# Patient Record
Sex: Male | Born: 1960
Health system: Southern US, Community
[De-identification: ages and names within clinical notes are randomized; demographics above are authoritative.]

## PROBLEM LIST (undated history)

## (undated) DIAGNOSIS — I10 Essential (primary) hypertension: Secondary | ICD-10-CM

## (undated) DIAGNOSIS — E785 Hyperlipidemia, unspecified: Secondary | ICD-10-CM

## (undated) HISTORY — DX: Hyperlipidemia, unspecified: E78.5

## (undated) HISTORY — DX: Essential (primary) hypertension: I10

---

## 2011-08-11 ENCOUNTER — Other Ambulatory Visit: Payer: Self-pay | Admitting: Otolaryngology

## 2011-08-16 ENCOUNTER — Ambulatory Visit
Admission: RE | Admit: 2011-08-16 | Discharge: 2011-08-16 | Disposition: A | Discharge status: Home / Self Care | Payer: BC Managed Care – PPO | Source: Ambulatory Visit | Attending: Otolaryngology | Admitting: Otolaryngology

## 2011-08-16 MED ORDER — GADOBENATE DIMEGLUMINE 529 MG/ML IV SOLN
16.0000 mL | Freq: Once | INTRAVENOUS | Status: AC | PRN
  Administered 2011-08-16: 16 mL via INTRAVENOUS

## 2011-08-17 ENCOUNTER — Other Ambulatory Visit: Payer: Self-pay | Admitting: Family Medicine

## 2011-08-17 DIAGNOSIS — N6312 Unspecified lump in the right breast, upper inner quadrant: Secondary | ICD-10-CM

## 2011-08-19 ENCOUNTER — Ambulatory Visit
Admission: RE | Admit: 2011-08-19 | Discharge: 2011-08-19 | Disposition: A | Discharge status: Home / Self Care | Payer: BC Managed Care – PPO | Source: Ambulatory Visit | Attending: Family Medicine | Admitting: Family Medicine

## 2011-08-19 DIAGNOSIS — N6312 Unspecified lump in the right breast, upper inner quadrant: Secondary | ICD-10-CM

## 2015-01-28 ENCOUNTER — Ambulatory Visit: Payer: Self-pay | Admitting: Podiatry

## 2015-09-11 ENCOUNTER — Other Ambulatory Visit: Payer: Self-pay | Admitting: Otolaryngology

## 2015-09-11 DIAGNOSIS — H903 Sensorineural hearing loss, bilateral: Secondary | ICD-10-CM

## 2015-09-19 ENCOUNTER — Ambulatory Visit
Admission: RE | Admit: 2015-09-19 | Discharge: 2015-09-19 | Disposition: A | Discharge status: Home / Self Care | Payer: Self-pay | Source: Ambulatory Visit | Attending: Otolaryngology | Admitting: Otolaryngology

## 2015-09-19 DIAGNOSIS — H903 Sensorineural hearing loss, bilateral: Secondary | ICD-10-CM

## 2015-09-19 MED ORDER — GADOBENATE DIMEGLUMINE 529 MG/ML IV SOLN
16.0000 mL | Freq: Once | INTRAVENOUS | Status: AC | PRN
  Administered 2015-09-19: 16 mL via INTRAVENOUS

## 2016-02-23 DIAGNOSIS — D333 Benign neoplasm of cranial nerves: Secondary | ICD-10-CM | POA: Diagnosis not present

## 2016-03-31 DIAGNOSIS — D333 Benign neoplasm of cranial nerves: Secondary | ICD-10-CM | POA: Diagnosis not present

## 2016-04-08 DIAGNOSIS — I10 Essential (primary) hypertension: Secondary | ICD-10-CM | POA: Diagnosis not present

## 2016-04-08 DIAGNOSIS — Z125 Encounter for screening for malignant neoplasm of prostate: Secondary | ICD-10-CM | POA: Diagnosis not present

## 2016-04-08 DIAGNOSIS — Z0000 Encounter for general adult medical examination without abnormal findings: Secondary | ICD-10-CM | POA: Diagnosis not present

## 2016-04-19 DIAGNOSIS — I10 Essential (primary) hypertension: Secondary | ICD-10-CM | POA: Diagnosis not present

## 2016-04-19 DIAGNOSIS — E784 Other hyperlipidemia: Secondary | ICD-10-CM | POA: Diagnosis not present

## 2016-04-19 DIAGNOSIS — R51 Headache: Secondary | ICD-10-CM | POA: Diagnosis not present

## 2016-04-19 DIAGNOSIS — Z125 Encounter for screening for malignant neoplasm of prostate: Secondary | ICD-10-CM | POA: Diagnosis not present

## 2016-04-19 DIAGNOSIS — Z0000 Encounter for general adult medical examination without abnormal findings: Secondary | ICD-10-CM | POA: Diagnosis not present

## 2016-04-19 DIAGNOSIS — R740 Nonspecific elevation of levels of transaminase and lactic acid dehydrogenase [LDH]: Secondary | ICD-10-CM | POA: Diagnosis not present

## 2016-04-19 DIAGNOSIS — Z1389 Encounter for screening for other disorder: Secondary | ICD-10-CM | POA: Diagnosis not present

## 2016-05-17 DIAGNOSIS — Z1212 Encounter for screening for malignant neoplasm of rectum: Secondary | ICD-10-CM | POA: Diagnosis not present

## 2016-06-08 DIAGNOSIS — M7062 Trochanteric bursitis, left hip: Secondary | ICD-10-CM | POA: Diagnosis not present

## 2016-06-08 DIAGNOSIS — Z6829 Body mass index (BMI) 29.0-29.9, adult: Secondary | ICD-10-CM | POA: Diagnosis not present

## 2016-06-10 DIAGNOSIS — Z79899 Other long term (current) drug therapy: Secondary | ICD-10-CM | POA: Diagnosis not present

## 2016-06-10 DIAGNOSIS — M5416 Radiculopathy, lumbar region: Secondary | ICD-10-CM | POA: Diagnosis not present

## 2016-09-19 DIAGNOSIS — D3610 Benign neoplasm of peripheral nerves and autonomic nervous system, unspecified: Secondary | ICD-10-CM | POA: Diagnosis not present

## 2016-09-19 DIAGNOSIS — Z6829 Body mass index (BMI) 29.0-29.9, adult: Secondary | ICD-10-CM | POA: Diagnosis not present

## 2016-09-19 DIAGNOSIS — I10 Essential (primary) hypertension: Secondary | ICD-10-CM | POA: Diagnosis not present

## 2016-09-19 DIAGNOSIS — H8113 Benign paroxysmal vertigo, bilateral: Secondary | ICD-10-CM | POA: Diagnosis not present

## 2016-11-14 DIAGNOSIS — D333 Benign neoplasm of cranial nerves: Secondary | ICD-10-CM | POA: Diagnosis not present

## 2016-11-14 DIAGNOSIS — D3610 Benign neoplasm of peripheral nerves and autonomic nervous system, unspecified: Secondary | ICD-10-CM | POA: Diagnosis not present

## 2016-12-10 IMAGING — MR MR HEAD WO/W CM
10 of 11 series · 42 of 48 positions shown · IV contrast (multihance)
Comparison: 08/16/2011

CLINICAL DATA: Sudden hearing loss on the right 2 months ago.

Creatinine was obtained on site at [HOSPITAL] at [HOSPITAL].
Results: Creatinine 1.1 mg/dL.
EXAM:
MRI HEAD WITHOUT AND WITH CONTRAST
TECHNIQUE: Multiplanar, multiecho pulse sequences of the brain and surrounding
structures were obtained without and with intravenous contrast.
CONTRAST:  16mL MULTIHANCE GADOBENATE DIMEGLUMINE 529 MG/ML IV SOLN

[Series 5: T1 · sagittal · 4.0mm · 0.72mm/px · 3 of 27 slices shown (1 of 3)]
[im 1/27]
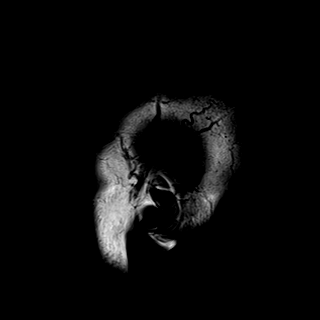
[im 14/27]
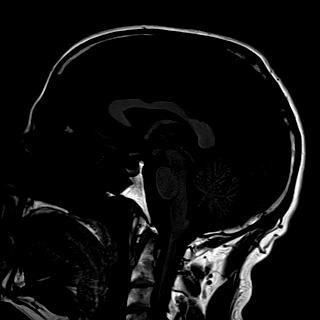
[im 27/27]
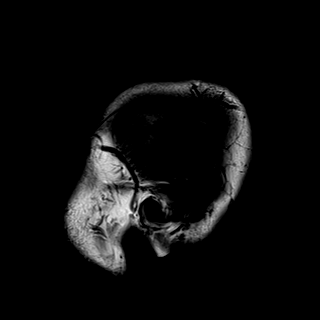

[Series 6: DWI · axial · 3.0mm · 1.44mm/px · z∈[-41,+115]mm · 9 of 86 slices shown (1 of 2)]
[im 1/86]
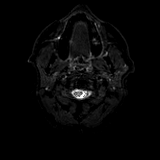
[im 11/86]
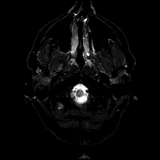
[im 22/86]
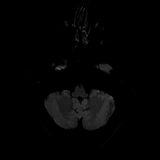
[im 32/86]
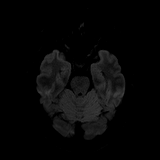
[im 43/86]
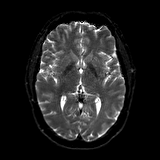
[im 54/86]
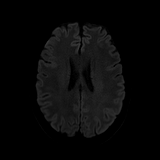
[im 64/86]
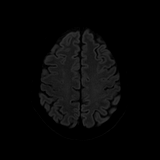
[im 75/86]
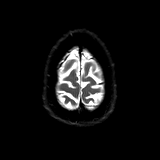
[im 86/86]
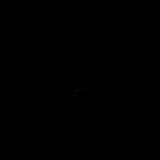

[Series 7: DWI · axial · 3.0mm · 1.44mm/px · z∈[-41,+115]mm · 4 of 43 slices shown (2 of 2)]
[im 1/43]
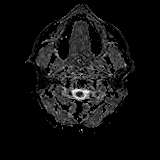
[im 15/43]
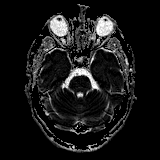
[im 29/43]
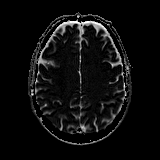
[im 43/43]
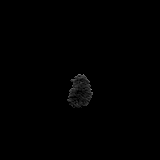

[Series 8: T2 · axial · 4.0mm · 0.36mm/px · z∈[-22,+107]mm · 3 of 27 slices shown]
[im 1/27]
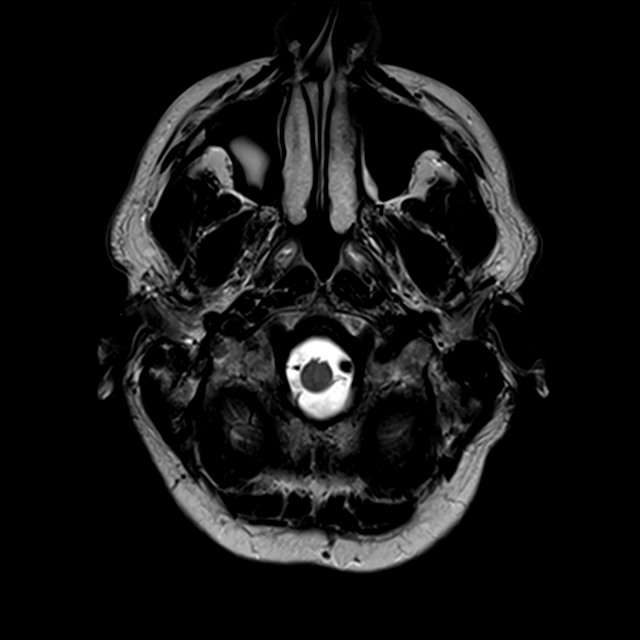
[im 14/27]
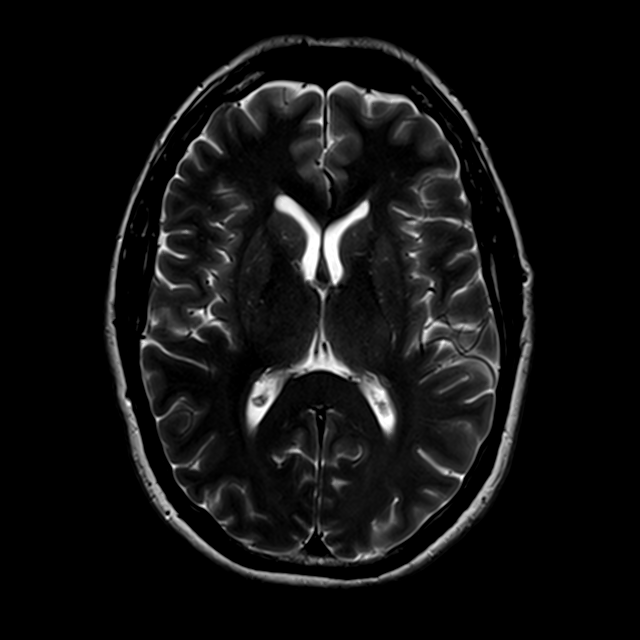
[im 27/27]
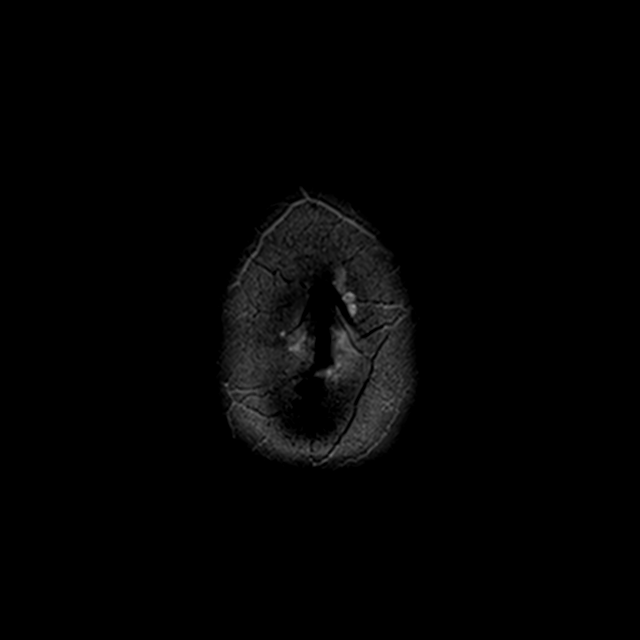

[Series 9: FLAIR · axial · 4.0mm · 0.72mm/px · z∈[-22,+107]mm · 3 of 27 slices shown]
[im 1/27]
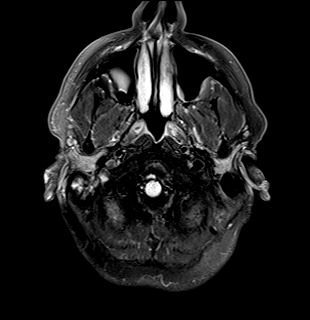
[im 14/27]
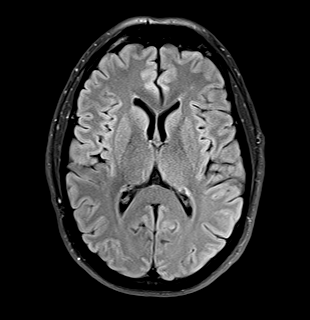
[im 27/27]
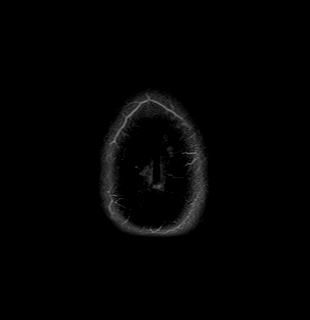

[Series 10: T1 · coronal · 2.5mm · 0.56mm/px · 1 of 11 slices shown (2 of 3)]
[im 1/11]
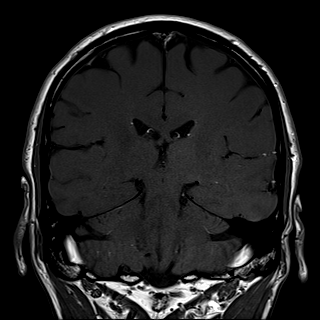

[Series 11: T1 · axial · 2.5mm · 0.59mm/px · 1 of 11 slices shown (3 of 3)]
[im 1/11]
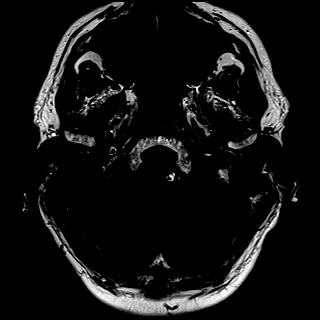

[Series 13: T1 post-contrast · coronal · 2.5mm · 0.56mm/px · 1 of 11 slices shown (1 of 3)]
[im 1/11]
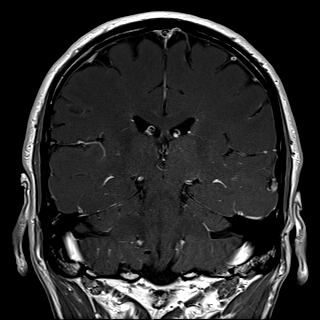

[Series 14: T1 post-contrast · axial · 2.5mm · 0.59mm/px · 1 of 11 slices shown (2 of 3)]
[im 1/11]
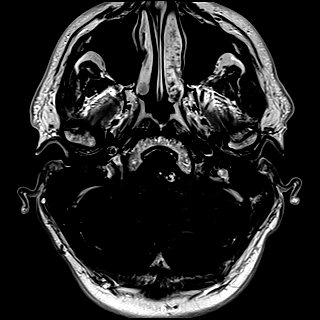

[Series 15: T1 post-contrast · axial · 1.0mm · 0.90mm/px · z∈[-43,+109]mm · 16 of 160 slices shown (3 of 3)]
[im 1/160]
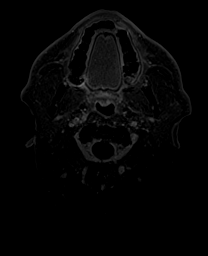
[im 11/160]
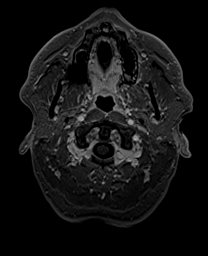
[im 22/160]
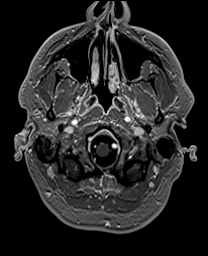
[im 32/160]
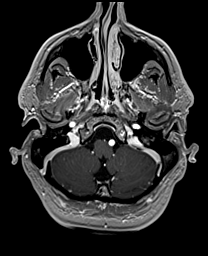
[im 43/160]
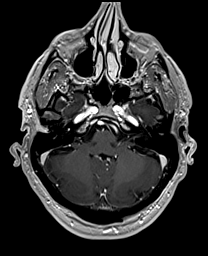
[im 54/160]
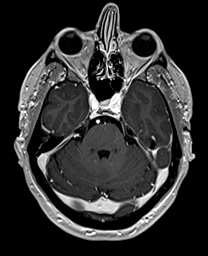
[im 64/160]
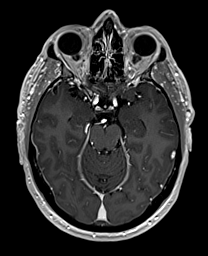
[im 75/160]
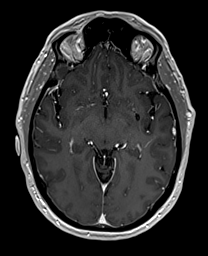
[im 85/160]
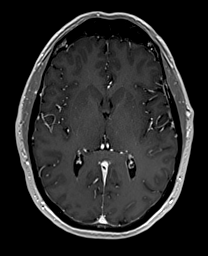
[im 96/160]
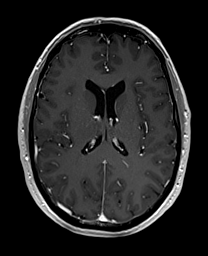
[im 107/160]
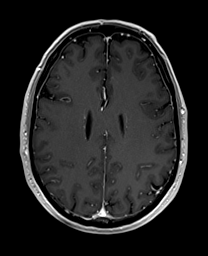
[im 117/160]
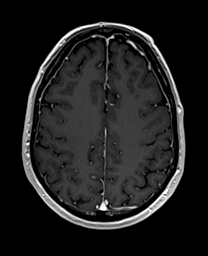
[im 128/160]
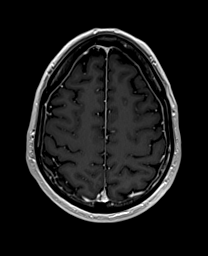
[im 138/160]
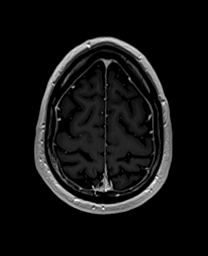
[im 149/160]
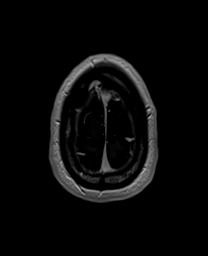
[im 160/160]
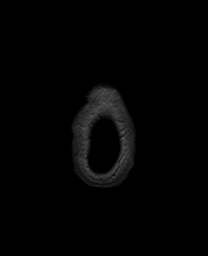

[42 of 48 positions shown; findings below may reference images not displayed]

FINDINGS: Diffusion imaging does not show any acute or subacute infarction. No
evidence of old small or large vessel insult. No accelerated brain
atrophy. No evidence of intra-axial mass lesion, hemorrhage,
hydrocephalus or extra-axial collection. No fluid in the sinuses,
middle ears or mastoids. No pituitary mass. Major vessels at the
base of the brain show flow.

CP angle regions are normal. No vestibular schwannoma or other
lesion. Seventh and eighth nerve complexes appear normal.

As seen previously, there is intense enhancement in the region of
the basal turn of the cochlear on the right. This is more prominent
than was seen previously. Whereas it is possible that the patient
could have recurrent viral labyrinthitis, today's findings elevate
the possibility of a schwannoma in this region.

No abnormal enhancement elsewhere on examination.
IMPRESSION: Re- demonstration of abnormal enhancement along the basal turn of
the cochlear on the right, more prominent than was seen on the study
of 5014. The differential diagnosis is that of recurrent viral
labyrinthitis versus mass lesion (schwannoma). Recurrence of viral
labyrinthitis would seem extraordinary. The fact that the
enhancement is more extensive today also favors the presence of an
underlying mass lesion. If the patient's symptoms were to resolve,
it would be interesting to repeat the scan to see if the enhancement
had resolved. That would exclude tumor.

## 2017-04-17 DIAGNOSIS — R82998 Other abnormal findings in urine: Secondary | ICD-10-CM | POA: Diagnosis not present

## 2017-04-17 DIAGNOSIS — Z0000 Encounter for general adult medical examination without abnormal findings: Secondary | ICD-10-CM | POA: Diagnosis not present

## 2017-04-17 DIAGNOSIS — Z125 Encounter for screening for malignant neoplasm of prostate: Secondary | ICD-10-CM | POA: Diagnosis not present

## 2017-04-17 DIAGNOSIS — I10 Essential (primary) hypertension: Secondary | ICD-10-CM | POA: Diagnosis not present

## 2017-04-25 DIAGNOSIS — Z1212 Encounter for screening for malignant neoplasm of rectum: Secondary | ICD-10-CM | POA: Diagnosis not present

## 2017-05-01 DIAGNOSIS — Z1389 Encounter for screening for other disorder: Secondary | ICD-10-CM | POA: Diagnosis not present

## 2017-05-01 DIAGNOSIS — E7849 Other hyperlipidemia: Secondary | ICD-10-CM | POA: Diagnosis not present

## 2017-05-01 DIAGNOSIS — Q678 Other congenital deformities of chest: Secondary | ICD-10-CM | POA: Diagnosis not present

## 2017-05-01 DIAGNOSIS — L218 Other seborrheic dermatitis: Secondary | ICD-10-CM | POA: Diagnosis not present

## 2017-05-01 DIAGNOSIS — Z0000 Encounter for general adult medical examination without abnormal findings: Secondary | ICD-10-CM | POA: Diagnosis not present

## 2017-05-01 DIAGNOSIS — I10 Essential (primary) hypertension: Secondary | ICD-10-CM | POA: Diagnosis not present

## 2017-10-30 DIAGNOSIS — L821 Other seborrheic keratosis: Secondary | ICD-10-CM | POA: Diagnosis not present

## 2017-10-30 DIAGNOSIS — D485 Neoplasm of uncertain behavior of skin: Secondary | ICD-10-CM | POA: Diagnosis not present

## 2017-10-30 DIAGNOSIS — D2271 Melanocytic nevi of right lower limb, including hip: Secondary | ICD-10-CM | POA: Diagnosis not present

## 2017-10-30 DIAGNOSIS — L988 Other specified disorders of the skin and subcutaneous tissue: Secondary | ICD-10-CM | POA: Diagnosis not present

## 2017-10-30 DIAGNOSIS — D2261 Melanocytic nevi of right upper limb, including shoulder: Secondary | ICD-10-CM | POA: Diagnosis not present

## 2017-12-12 DIAGNOSIS — D485 Neoplasm of uncertain behavior of skin: Secondary | ICD-10-CM | POA: Diagnosis not present

## 2017-12-22 DIAGNOSIS — Z4802 Encounter for removal of sutures: Secondary | ICD-10-CM | POA: Diagnosis not present

## 2018-04-19 DIAGNOSIS — Z6827 Body mass index (BMI) 27.0-27.9, adult: Secondary | ICD-10-CM | POA: Diagnosis not present

## 2018-04-19 DIAGNOSIS — J09X2 Influenza due to identified novel influenza A virus with other respiratory manifestations: Secondary | ICD-10-CM | POA: Diagnosis not present

## 2018-05-01 DIAGNOSIS — I10 Essential (primary) hypertension: Secondary | ICD-10-CM | POA: Diagnosis not present

## 2018-05-01 DIAGNOSIS — R82998 Other abnormal findings in urine: Secondary | ICD-10-CM | POA: Diagnosis not present

## 2018-05-01 DIAGNOSIS — Z0000 Encounter for general adult medical examination without abnormal findings: Secondary | ICD-10-CM | POA: Diagnosis not present

## 2018-05-01 DIAGNOSIS — Z125 Encounter for screening for malignant neoplasm of prostate: Secondary | ICD-10-CM | POA: Diagnosis not present

## 2018-05-04 DIAGNOSIS — Z1212 Encounter for screening for malignant neoplasm of rectum: Secondary | ICD-10-CM | POA: Diagnosis not present

## 2018-05-08 DIAGNOSIS — E7849 Other hyperlipidemia: Secondary | ICD-10-CM | POA: Diagnosis not present

## 2018-05-08 DIAGNOSIS — I10 Essential (primary) hypertension: Secondary | ICD-10-CM | POA: Diagnosis not present

## 2018-05-08 DIAGNOSIS — D3610 Benign neoplasm of peripheral nerves and autonomic nervous system, unspecified: Secondary | ICD-10-CM | POA: Diagnosis not present

## 2018-05-08 DIAGNOSIS — Z1331 Encounter for screening for depression: Secondary | ICD-10-CM | POA: Diagnosis not present

## 2018-05-08 DIAGNOSIS — H9193 Unspecified hearing loss, bilateral: Secondary | ICD-10-CM | POA: Diagnosis not present

## 2018-05-08 DIAGNOSIS — Z0000 Encounter for general adult medical examination without abnormal findings: Secondary | ICD-10-CM | POA: Diagnosis not present

## 2018-09-17 DIAGNOSIS — R972 Elevated prostate specific antigen [PSA]: Secondary | ICD-10-CM | POA: Diagnosis not present

## 2018-12-21 DIAGNOSIS — Z23 Encounter for immunization: Secondary | ICD-10-CM | POA: Diagnosis not present

## 2019-01-01 DIAGNOSIS — D333 Benign neoplasm of cranial nerves: Secondary | ICD-10-CM | POA: Diagnosis not present

## 2019-01-24 DIAGNOSIS — D333 Benign neoplasm of cranial nerves: Secondary | ICD-10-CM | POA: Diagnosis not present

## 2019-01-24 DIAGNOSIS — H5213 Myopia, bilateral: Secondary | ICD-10-CM | POA: Diagnosis not present

## 2019-01-24 DIAGNOSIS — H43391 Other vitreous opacities, right eye: Secondary | ICD-10-CM | POA: Diagnosis not present

## 2019-01-24 DIAGNOSIS — H524 Presbyopia: Secondary | ICD-10-CM | POA: Diagnosis not present

## 2019-01-24 DIAGNOSIS — H52223 Regular astigmatism, bilateral: Secondary | ICD-10-CM | POA: Diagnosis not present

## 2019-01-24 DIAGNOSIS — H905 Unspecified sensorineural hearing loss: Secondary | ICD-10-CM | POA: Diagnosis not present

## 2019-01-24 DIAGNOSIS — H538 Other visual disturbances: Secondary | ICD-10-CM | POA: Diagnosis not present

## 2019-02-26 DIAGNOSIS — L821 Other seborrheic keratosis: Secondary | ICD-10-CM | POA: Diagnosis not present

## 2019-02-26 DIAGNOSIS — D2261 Melanocytic nevi of right upper limb, including shoulder: Secondary | ICD-10-CM | POA: Diagnosis not present

## 2019-02-26 DIAGNOSIS — D225 Melanocytic nevi of trunk: Secondary | ICD-10-CM | POA: Diagnosis not present

## 2019-02-26 DIAGNOSIS — D2262 Melanocytic nevi of left upper limb, including shoulder: Secondary | ICD-10-CM | POA: Diagnosis not present

## 2019-05-07 DIAGNOSIS — I10 Essential (primary) hypertension: Secondary | ICD-10-CM | POA: Diagnosis not present

## 2019-05-07 DIAGNOSIS — Z0000 Encounter for general adult medical examination without abnormal findings: Secondary | ICD-10-CM | POA: Diagnosis not present

## 2019-05-07 DIAGNOSIS — E7849 Other hyperlipidemia: Secondary | ICD-10-CM | POA: Diagnosis not present

## 2019-05-10 DIAGNOSIS — I10 Essential (primary) hypertension: Secondary | ICD-10-CM | POA: Diagnosis not present

## 2019-05-10 DIAGNOSIS — Z1212 Encounter for screening for malignant neoplasm of rectum: Secondary | ICD-10-CM | POA: Diagnosis not present

## 2019-05-10 DIAGNOSIS — R82998 Other abnormal findings in urine: Secondary | ICD-10-CM | POA: Diagnosis not present

## 2019-05-13 DIAGNOSIS — Z1331 Encounter for screening for depression: Secondary | ICD-10-CM | POA: Diagnosis not present

## 2019-05-13 DIAGNOSIS — Z0000 Encounter for general adult medical examination without abnormal findings: Secondary | ICD-10-CM | POA: Diagnosis not present

## 2019-05-29 ENCOUNTER — Encounter: Payer: Self-pay | Admitting: Podiatry

## 2019-05-29 ENCOUNTER — Ambulatory Visit: Payer: BC Managed Care – PPO | Admitting: Podiatry

## 2019-05-29 ENCOUNTER — Other Ambulatory Visit: Payer: Self-pay | Admitting: Podiatry

## 2019-05-29 ENCOUNTER — Ambulatory Visit: Payer: BC Managed Care – PPO

## 2019-05-29 ENCOUNTER — Ambulatory Visit (INDEPENDENT_AMBULATORY_CARE_PROVIDER_SITE_OTHER): Payer: BC Managed Care – PPO

## 2019-05-29 ENCOUNTER — Other Ambulatory Visit: Payer: Self-pay

## 2019-05-29 VITALS — Temp 97.2°F

## 2019-05-29 DIAGNOSIS — M79672 Pain in left foot: Secondary | ICD-10-CM

## 2019-05-29 DIAGNOSIS — M779 Enthesopathy, unspecified: Secondary | ICD-10-CM | POA: Diagnosis not present

## 2019-05-29 DIAGNOSIS — M79671 Pain in right foot: Secondary | ICD-10-CM

## 2019-05-29 NOTE — Progress Notes (Signed)
Subjective:   Patient ID: William Carrillo, male   DOB: 59 y.o.   MRN: [id number]   HPI Patient presents stating he went for a walk and started develop a lot of pain in his left foot.  He does not remember specific injury and states that he is used ice and elevation and is not as sore but still sore.  Patient does not smoke likes to be active   Review of Systems  All other systems reviewed and are negative.       Objective:  Physical Exam Vitals and nursing note reviewed.  Constitutional:      Appearance: He is well-developed.  Pulmonary:     Effort: Pulmonary effort is normal.  Musculoskeletal:        General: Normal range of motion.  Skin:    General: Skin is warm.  Neurological:     Mental Status: He is alert.     Neurovascular status intact muscle strength adequate range of motion within normal limits with patient found to have a relatively cold left foot since the injury.  I noted there to be inflammation pain of the left first MPJ lateral side with no restriction of joint motion or crepitus.  Patient has good digital perfusion well oriented x3     Assessment:  Probability for inflammatory capsulitis left first MPJ with possible mild arthritis and cool foot which I am hoping will warm up as the inflammation reduces     Plan:  H&P reviewed condition and did sterile prep and injected the lateral capsule 3 mg Dexasone Kenalog 5 mg Xylocaine and advised on warming type activities.  Placed on oral anti-inflammatory and reappoint for [place] to recheck again in 2 weeks  X-rays indicate the joint is open with the possibility for mild spur formation lateral side of the joint surface

## 2019-06-12 ENCOUNTER — Ambulatory Visit: Payer: BC Managed Care – PPO | Admitting: Podiatry

## 2019-06-12 ENCOUNTER — Other Ambulatory Visit: Payer: Self-pay

## 2019-06-12 ENCOUNTER — Encounter: Payer: Self-pay | Admitting: Podiatry

## 2019-06-12 VITALS — Temp 97.4°F

## 2019-06-12 DIAGNOSIS — M779 Enthesopathy, unspecified: Secondary | ICD-10-CM

## 2019-06-12 NOTE — Progress Notes (Signed)
Subjective:   Patient ID: William Carrillo, male   DOB: 59 y.o.   MRN: [id number]   HPI Patient presents stating he is feeling quite a bit better with pain still noted upon deep palpation but overall improved nerve   ROS      Objective:  Physical Exam  Vascular status intact with left foot pain that is better with pain upon deep palpation but improved from previous     Assessment:  Fasciitis tendinitis-like symptoms improved currently     Plan:  H&P reviewed the consideration of continued conservative care and long-term orthotics which may be necessary.  At this point we will get a try to treat conservatively continue with current treatment and will be seen back to reevaluate and may require more aggressive treatment plan

## 2020-01-20 ENCOUNTER — Other Ambulatory Visit: Payer: Self-pay

## 2020-01-20 ENCOUNTER — Ambulatory Visit: Payer: BC Managed Care – PPO | Admitting: Podiatry

## 2020-01-20 ENCOUNTER — Encounter: Payer: Self-pay | Admitting: Podiatry

## 2020-01-20 DIAGNOSIS — M722 Plantar fascial fibromatosis: Secondary | ICD-10-CM

## 2020-01-20 DIAGNOSIS — M7672 Peroneal tendinitis, left leg: Secondary | ICD-10-CM

## 2020-01-20 MED ORDER — DICLOFENAC SODIUM 75 MG PO TBEC: 75.0000 mg | DELAYED_RELEASE_TABLET | Freq: Two times a day (BID) | ORAL | 2 refills | Status: AC

## 2020-01-20 NOTE — Patient Instructions (Signed)

## 2020-01-21 NOTE — Progress Notes (Signed)
Subjective:   Patient ID: William Carrillo, male   DOB: 59 y.o.   MRN: [id number]   HPI Patient states he developed pain in his left heel over the last few months and is gradually getting worse and making weightbearing difficult and he has tried shoe gear modification stretching   ROS      Objective:  Physical Exam  Neurovascular status intact with patient's left plantar fascia at the insertion calcaneus quite inflamed fluid buildup around the medial band at the insertion into the calcaneal bone     Assessment:  Acute plantar fasciitis left inflammation fluid buildup     Plan:  H&P reviewed condition and did sterile prep injected the fascia 3 mg Kenalog 5 mg liken applied fascial brace to support the arch and instructed on stretching exercises physical therapy.  Reappoint to recheck  X-rays indicate that there is moderate depression the arch small spur no indication stress fracture arthritis

## 2020-01-23 ENCOUNTER — Other Ambulatory Visit: Payer: Self-pay

## 2020-01-23 ENCOUNTER — Other Ambulatory Visit (HOSPITAL_COMMUNITY): Payer: Self-pay | Admitting: Registered Nurse

## 2020-01-23 ENCOUNTER — Ambulatory Visit (HOSPITAL_COMMUNITY)
Admission: RE | Admit: 2020-01-23 | Discharge: 2020-01-23 | Disposition: A | Discharge status: Home / Self Care | Payer: BC Managed Care – PPO | Source: Ambulatory Visit | Attending: Internal Medicine | Admitting: Internal Medicine

## 2020-01-23 DIAGNOSIS — R52 Pain, unspecified: Secondary | ICD-10-CM

## 2020-01-23 DIAGNOSIS — R252 Cramp and spasm: Secondary | ICD-10-CM | POA: Diagnosis not present

## 2020-01-23 DIAGNOSIS — I10 Essential (primary) hypertension: Secondary | ICD-10-CM | POA: Diagnosis not present

## 2020-01-29 ENCOUNTER — Ambulatory Visit: Payer: BC Managed Care – PPO | Admitting: Podiatry

## 2020-01-29 ENCOUNTER — Other Ambulatory Visit: Payer: Self-pay

## 2020-01-29 ENCOUNTER — Encounter: Payer: Self-pay | Admitting: Podiatry

## 2020-01-29 DIAGNOSIS — M722 Plantar fascial fibromatosis: Secondary | ICD-10-CM | POA: Diagnosis not present

## 2020-01-29 NOTE — Progress Notes (Signed)
Subjective:   Patient ID: William Carrillo, male   DOB: 59 y.o.   MRN: [id number]   HPI Patient presents stating my arch seems to be quite a bit better on having some pain in my heel left   ROS      Objective:  Physical Exam  Neurovascular status intact with inflammation pain more in the fascial band of the left heel with the arch doing better and slight forefoot discomfort      Assessment:  Fasciitis improving left arch with inflammation of the insertional point tendon with mild forefoot pain     Plan:  H&P reviewed condition sterile prep and injected the left fascia 3 mg Kenalog 5 mg Xylocaine advised on anti-inflammatories supportive shoe stretch and if symptoms persist will be seen back

## 2020-02-24 DIAGNOSIS — Z0110 Encounter for examination of ears and hearing without abnormal findings: Secondary | ICD-10-CM | POA: Diagnosis not present

## 2020-02-24 DIAGNOSIS — H903 Sensorineural hearing loss, bilateral: Secondary | ICD-10-CM | POA: Diagnosis not present

## 2020-02-26 DIAGNOSIS — D2262 Melanocytic nevi of left upper limb, including shoulder: Secondary | ICD-10-CM | POA: Diagnosis not present

## 2020-02-26 DIAGNOSIS — D225 Melanocytic nevi of trunk: Secondary | ICD-10-CM | POA: Diagnosis not present

## 2020-02-26 DIAGNOSIS — L814 Other melanin hyperpigmentation: Secondary | ICD-10-CM | POA: Diagnosis not present

## 2020-02-26 DIAGNOSIS — L821 Other seborrheic keratosis: Secondary | ICD-10-CM | POA: Diagnosis not present

## 2020-03-20 DIAGNOSIS — S99922A Unspecified injury of left foot, initial encounter: Secondary | ICD-10-CM | POA: Diagnosis not present

## 2020-03-20 DIAGNOSIS — M79675 Pain in left toe(s): Secondary | ICD-10-CM | POA: Diagnosis not present

## 2020-03-23 ENCOUNTER — Ambulatory Visit: Payer: BC Managed Care – PPO | Admitting: Podiatry

## 2020-03-25 ENCOUNTER — Ambulatory Visit: Payer: BC Managed Care – PPO | Admitting: Podiatry

## 2020-03-25 ENCOUNTER — Ambulatory Visit (INDEPENDENT_AMBULATORY_CARE_PROVIDER_SITE_OTHER): Payer: BC Managed Care – PPO

## 2020-03-25 ENCOUNTER — Encounter: Payer: Self-pay | Admitting: Podiatry

## 2020-03-25 ENCOUNTER — Other Ambulatory Visit: Payer: Self-pay

## 2020-03-25 DIAGNOSIS — M779 Enthesopathy, unspecified: Secondary | ICD-10-CM | POA: Diagnosis not present

## 2020-03-25 DIAGNOSIS — Z1159 Encounter for screening for other viral diseases: Secondary | ICD-10-CM | POA: Diagnosis not present

## 2020-03-25 DIAGNOSIS — S9032XA Contusion of left foot, initial encounter: Secondary | ICD-10-CM

## 2020-03-25 NOTE — Progress Notes (Signed)
Subjective:   Patient ID: William Carrillo, male   DOB: 59 y.o.   MRN: [id number]   HPI Patient presents stating he has bruising on his left big toe that he was concerned about   ROS      Objective:  Physical Exam  Neurovascular status intact with bruising the left big toe medial side with minimal pain     Assessment:  Probability for contusion of big toe cannot rule out bony injury     Plan:  Precautionary x-ray taken reviewed advised on soaks and I did discuss if the flexor tendon remains weak we may have to look at that but I do not think it will be a pathological issue for  X-rays were negative for signs of fracture or bony issue

## 2020-05-21 DIAGNOSIS — H52223 Regular astigmatism, bilateral: Secondary | ICD-10-CM | POA: Diagnosis not present

## 2020-05-21 DIAGNOSIS — H524 Presbyopia: Secondary | ICD-10-CM | POA: Diagnosis not present

## 2020-05-21 DIAGNOSIS — H5213 Myopia, bilateral: Secondary | ICD-10-CM | POA: Diagnosis not present

## 2020-05-21 DIAGNOSIS — H43391 Other vitreous opacities, right eye: Secondary | ICD-10-CM | POA: Diagnosis not present

## 2020-06-05 DIAGNOSIS — E785 Hyperlipidemia, unspecified: Secondary | ICD-10-CM | POA: Diagnosis not present

## 2020-06-05 DIAGNOSIS — Z125 Encounter for screening for malignant neoplasm of prostate: Secondary | ICD-10-CM | POA: Diagnosis not present

## 2020-06-11 DIAGNOSIS — Z1212 Encounter for screening for malignant neoplasm of rectum: Secondary | ICD-10-CM | POA: Diagnosis not present

## 2020-06-12 DIAGNOSIS — Z0000 Encounter for general adult medical examination without abnormal findings: Secondary | ICD-10-CM | POA: Diagnosis not present

## 2020-06-12 DIAGNOSIS — R82998 Other abnormal findings in urine: Secondary | ICD-10-CM | POA: Diagnosis not present

## 2020-06-12 DIAGNOSIS — I10 Essential (primary) hypertension: Secondary | ICD-10-CM | POA: Diagnosis not present

## 2021-03-10 DIAGNOSIS — D2271 Melanocytic nevi of right lower limb, including hip: Secondary | ICD-10-CM | POA: Diagnosis not present

## 2021-03-10 DIAGNOSIS — D225 Melanocytic nevi of trunk: Secondary | ICD-10-CM | POA: Diagnosis not present

## 2021-03-10 DIAGNOSIS — L821 Other seborrheic keratosis: Secondary | ICD-10-CM | POA: Diagnosis not present

## 2021-03-10 DIAGNOSIS — L570 Actinic keratosis: Secondary | ICD-10-CM | POA: Diagnosis not present

## 2021-06-17 DIAGNOSIS — Z125 Encounter for screening for malignant neoplasm of prostate: Secondary | ICD-10-CM | POA: Diagnosis not present

## 2021-06-17 DIAGNOSIS — I10 Essential (primary) hypertension: Secondary | ICD-10-CM | POA: Diagnosis not present

## 2021-06-22 DIAGNOSIS — E785 Hyperlipidemia, unspecified: Secondary | ICD-10-CM | POA: Diagnosis not present

## 2021-06-22 DIAGNOSIS — Z1339 Encounter for screening examination for other mental health and behavioral disorders: Secondary | ICD-10-CM | POA: Diagnosis not present

## 2021-06-22 DIAGNOSIS — D3610 Benign neoplasm of peripheral nerves and autonomic nervous system, unspecified: Secondary | ICD-10-CM | POA: Diagnosis not present

## 2021-06-22 DIAGNOSIS — I10 Essential (primary) hypertension: Secondary | ICD-10-CM | POA: Diagnosis not present

## 2021-06-22 DIAGNOSIS — Z1331 Encounter for screening for depression: Secondary | ICD-10-CM | POA: Diagnosis not present

## 2021-06-22 DIAGNOSIS — Z0000 Encounter for general adult medical examination without abnormal findings: Secondary | ICD-10-CM | POA: Diagnosis not present

## 2021-08-09 DIAGNOSIS — R972 Elevated prostate specific antigen [PSA]: Secondary | ICD-10-CM | POA: Diagnosis not present

## 2021-08-09 DIAGNOSIS — M545 Low back pain, unspecified: Secondary | ICD-10-CM | POA: Diagnosis not present

## 2022-02-07 DIAGNOSIS — D333 Benign neoplasm of cranial nerves: Secondary | ICD-10-CM | POA: Diagnosis not present

## 2022-03-03 DIAGNOSIS — M109 Gout, unspecified: Secondary | ICD-10-CM | POA: Diagnosis not present

## 2022-03-03 DIAGNOSIS — M79672 Pain in left foot: Secondary | ICD-10-CM | POA: Diagnosis not present

## 2022-03-10 DIAGNOSIS — M109 Gout, unspecified: Secondary | ICD-10-CM | POA: Diagnosis not present

## 2022-04-01 DIAGNOSIS — D225 Melanocytic nevi of trunk: Secondary | ICD-10-CM | POA: Diagnosis not present

## 2022-04-01 DIAGNOSIS — D2271 Melanocytic nevi of right lower limb, including hip: Secondary | ICD-10-CM | POA: Diagnosis not present

## 2022-04-01 DIAGNOSIS — L821 Other seborrheic keratosis: Secondary | ICD-10-CM | POA: Diagnosis not present

## 2022-04-01 DIAGNOSIS — L570 Actinic keratosis: Secondary | ICD-10-CM | POA: Diagnosis not present

## 2022-04-01 DIAGNOSIS — L814 Other melanin hyperpigmentation: Secondary | ICD-10-CM | POA: Diagnosis not present

## 2022-04-25 ENCOUNTER — Encounter: Payer: Self-pay | Admitting: Gastroenterology

## 2022-05-18 ENCOUNTER — Encounter: Payer: Self-pay | Admitting: Gastroenterology

## 2022-05-18 ENCOUNTER — Ambulatory Visit (AMBULATORY_SURGERY_CENTER): Payer: BC Managed Care – PPO

## 2022-05-18 VITALS — Ht 67.0 in | Wt 182.0 lb

## 2022-05-18 DIAGNOSIS — Z1211 Encounter for screening for malignant neoplasm of colon: Secondary | ICD-10-CM

## 2022-05-18 MED ORDER — NA SULFATE-K SULFATE-MG SULF 17.5-3.13-1.6 GM/177ML PO SOLN: 1.0000 | Freq: Once | ORAL | 0 refills | Status: AC

## 2022-05-18 NOTE — Progress Notes (Signed)

## 2022-05-27 ENCOUNTER — Encounter: Payer: Self-pay | Admitting: Gastroenterology

## 2022-06-03 DIAGNOSIS — M109 Gout, unspecified: Secondary | ICD-10-CM | POA: Diagnosis not present

## 2022-06-03 DIAGNOSIS — M722 Plantar fascial fibromatosis: Secondary | ICD-10-CM | POA: Diagnosis not present

## 2022-06-03 DIAGNOSIS — M79671 Pain in right foot: Secondary | ICD-10-CM | POA: Diagnosis not present

## 2022-06-08 ENCOUNTER — Encounter: Payer: Self-pay | Admitting: Certified Registered Nurse Anesthetist

## 2022-06-09 ENCOUNTER — Ambulatory Visit (AMBULATORY_SURGERY_CENTER): Payer: BC Managed Care – PPO | Admitting: Gastroenterology

## 2022-06-09 ENCOUNTER — Encounter: Payer: Self-pay | Admitting: Gastroenterology

## 2022-06-09 VITALS — BP 109/79 | HR 69 | Temp 98.6°F | Resp 12 | Ht 67.0 in | Wt 182.0 lb

## 2022-06-09 DIAGNOSIS — D123 Benign neoplasm of transverse colon: Secondary | ICD-10-CM

## 2022-06-09 DIAGNOSIS — Z1211 Encounter for screening for malignant neoplasm of colon: Secondary | ICD-10-CM

## 2022-06-09 DIAGNOSIS — D120 Benign neoplasm of cecum: Secondary | ICD-10-CM | POA: Diagnosis not present

## 2022-06-09 DIAGNOSIS — D122 Benign neoplasm of ascending colon: Secondary | ICD-10-CM

## 2022-06-09 MED ORDER — SODIUM CHLORIDE 0.9 % IV SOLN: 500.0000 mL | INTRAVENOUS | Status: DC

## 2022-06-09 NOTE — Patient Instructions (Signed)
Please read handouts provided. Continue present medications. Await pathology results.   YOU HAD AN ENDOSCOPIC PROCEDURE TODAY AT THE [facility]:   Refer to the procedure report that was given to you for any specific questions about what was found during the examination.  If the procedure report does not answer your questions, please call your gastroenterologist to clarify.  If you requested that your care partner not be given the details of your procedure findings, then the procedure report has been included in a sealed envelope for you to review at your convenience later.  YOU SHOULD EXPECT: Some feelings of bloating in the abdomen. Passage of more gas than usual.  Walking can help get rid of the air that was put into your GI tract during the procedure and reduce the bloating. If you had a lower endoscopy (such as a colonoscopy or flexible sigmoidoscopy) you may notice spotting of blood in your stool or on the toilet paper. If you underwent a bowel prep for your procedure, you may not have a normal bowel movement for a few days.  Please Note:  You might notice some irritation and congestion in your nose or some drainage.  This is from the oxygen used during your procedure.  There is no need for concern and it should clear up in a day or so.  SYMPTOMS TO REPORT IMMEDIATELY:  Following lower endoscopy (colonoscopy or flexible sigmoidoscopy):  Excessive amounts of blood in the stool  Significant tenderness or worsening of abdominal pains  Swelling of the abdomen that is new, acute  Fever of 100F or higher   For urgent or emergent issues, a gastroenterologist can be reached at any hour by calling [phone]. Do not use MyChart messaging for urgent concerns.    DIET:  We do recommend a small meal at first, but then you may proceed to your regular diet.  Drink plenty of fluids but you should avoid alcoholic beverages for 24 hours.  ACTIVITY:  You should plan to take it easy  for the rest of today and you should NOT DRIVE or use heavy machinery until tomorrow (because of the sedation medicines used during the test).    FOLLOW UP: Our staff will call the number listed on your records the next business day following your procedure.  We will call around 7:15- 8:00 am to check on you and address any questions or concerns that you may have regarding the information given to you following your procedure. If we do not reach you, we will leave a message.     If any biopsies were taken you will be contacted by phone or by letter within the next 1-3 weeks.  Please call us at [phone] if you have not heard about the biopsies in 3 weeks.    SIGNATURES/CONFIDENTIALITY: You and/or your care partner have signed paperwork which will be entered into your electronic medical record.  These signatures attest to the fact that that the information above on your After Visit Summary has been reviewed and is understood.  Full responsibility of the confidentiality of this discharge information lies with you and/or your care-partner. 

## 2022-06-09 NOTE — Progress Notes (Signed)
Report given to PACU, vss 

## 2022-06-09 NOTE — Op Note (Signed)
[facility] Patient Name: William Carrillo Procedure Date: 06/09/2022 7:59 AM MRN: [id number] Endoscopist: Mauri Pole , MD, [id number] Age: 62 Referring MD:  Date of Birth: Sep 12, 1960 Gender: Male Account #: [id number] Procedure:                Colonoscopy Indications:              Screening for colorectal malignant neoplasm Medicines:                Monitored Anesthesia Care Procedure:                Pre-Anesthesia Assessment:                           - Prior to the procedure, a History and Physical                            was performed, and patient medications and                            allergies were reviewed. The patient's tolerance of                            previous anesthesia was also reviewed. The risks                            and benefits of the procedure and the sedation                            options and risks were discussed with the patient.                            All questions were answered, and informed consent                            was obtained. Prior Anticoagulants: The patient has                            taken no anticoagulant or antiplatelet agents. ASA                            Grade Assessment: II - A patient with mild systemic                            disease. After reviewing the risks and benefits,                            the patient was deemed in satisfactory condition to                            undergo the procedure.                           After obtaining informed consent, the colonoscope  was passed under direct vision. Throughout the                            procedure, the patient's blood pressure, pulse, and                            oxygen saturations were monitored continuously. The                            Olympus [id number]-H190DL [id number]) Colonoscope was                            introduced through the anus and advanced to the the                            cecum,  identified by appendiceal orifice and                            ileocecal valve. The colonoscopy was performed                            without difficulty. The patient tolerated the                            procedure well. The quality of the bowel                            preparation was good. The ileocecal valve,                            appendiceal orifice, and rectum were photographed. Scope In: 7:59:13 AM Scope Out: 8:20:51 AM Scope Withdrawal Time: 0 hours 14 minutes 44 seconds  Total Procedure Duration: 0 hours 21 minutes 38 seconds  Findings:                 The perianal and digital rectal examinations were                            normal.                           Two sessile polyps were found in the ascending                            colon and cecum. The polyps were 4 to 5 mm in size.                            These polyps were removed with a cold snare.                            Resection and retrieval were complete.                           Two sessile polyps were found in the transverse  colon and ascending colon. The polyps were 1 to 2                            mm in size. These polyps were removed with a cold                            biopsy forceps. Resection and retrieval were                            complete.                           Scattered large-mouthed, medium-mouthed and                            small-mouthed diverticula were found in the sigmoid                            colon, descending colon, transverse colon and                            ascending colon.                           Non-bleeding external and internal hemorrhoids were                            found during retroflexion. The hemorrhoids were                            medium-sized. Complications:            No immediate complications. Estimated Blood Loss:     Estimated blood loss was minimal. Impression:               - Two 4 to 5 mm polyps in  the ascending colon and                            in the cecum, removed with a cold snare. Resected                            and retrieved.                           - Two 1 to 2 mm polyps in the transverse colon and                            in the ascending colon, removed with a cold biopsy                            forceps. Resected and retrieved.                           - Moderate diverticulosis in the sigmoid colon, in  the descending colon, in the transverse colon and                            in the ascending colon.                           - Non-bleeding external and internal hemorrhoids. Recommendation:           - Patient has a contact number available for                            emergencies. The signs and symptoms of potential                            delayed complications were discussed with the                            patient. Return to normal activities tomorrow.                            Written discharge instructions were provided to the                            patient.                           - Resume previous diet.                           - Continue present medications.                           - Await pathology results.                           - Repeat colonoscopy in 3 - 5 years for                            surveillance based on pathology results. Mauri Pole, MD 06/09/2022 8:29:07 AM This report has been signed electronically.

## 2022-06-09 NOTE — Progress Notes (Signed)
Pt's states no medical or surgical changes since previsit or office visit. 

## 2022-06-09 NOTE — Progress Notes (Signed)
[facility] History and Physical   Primary Care Physician:  Velna Hatchet, MD   Reason for Procedure:  Colorectal cancer screening  Plan:    Screening colonoscopy with possible interventions as needed     HPI: William Carrillo is a very pleasant 62 y.o. male here for screening colonoscopy. Denies any nausea, vomiting, abdominal pain, melena or bright red blood per rectum  The risks and benefits as well as alternatives of endoscopic procedure(s) have been discussed and reviewed. All questions answered. The patient agrees to proceed.    Past Medical History:  Diagnosis Date   Hyperlipidemia    Hypertension     History reviewed. No pertinent surgical history.  Prior to Admission medications   Medication Sig Start Date End Date Taking? Authorizing Provider  atorvastatin (LIPITOR) 20 MG tablet Take 20 mg by mouth daily. 04/10/19  Yes [provider]  candesartan (ATACAND) 8 MG tablet Take 8 mg by mouth daily. 04/09/19  Yes [provider]  colchicine 0.6 MG tablet Take 0.6 mg by mouth 3 (three) times daily.    [provider]  diclofenac (VOLTAREN) 75 MG EC tablet Take 1 tablet (75 mg total) by mouth 2 (two) times daily. 01/20/20   Wallene Huh, DPM  indomethacin (INDOCIN) 50 MG capsule Take by mouth. 03/10/22   [provider]    Current Outpatient Medications  Medication Sig Dispense Refill   atorvastatin (LIPITOR) 20 MG tablet Take 20 mg by mouth daily.     candesartan (ATACAND) 8 MG tablet Take 8 mg by mouth daily.     colchicine 0.6 MG tablet Take 0.6 mg by mouth 3 (three) times daily.     diclofenac (VOLTAREN) 75 MG EC tablet Take 1 tablet (75 mg total) by mouth 2 (two) times daily. 50 tablet 2   indomethacin (INDOCIN) 50 MG capsule Take by mouth.     Current Facility-Administered Medications  Medication Dose Route Frequency Provider Last Rate Last Admin   0.9 %  sodium chloride infusion  500 mL Intravenous Continuous  Aundrea Horace, Venia Minks, MD        Allergies as of 06/09/2022   (No Known Allergies)    Family History  Problem Relation Age of Onset   Colon cancer Neg Hx    Colon polyps Neg Hx    Esophageal cancer Neg Hx    Rectal cancer Neg Hx    Stomach cancer Neg Hx     Social History   Socioeconomic History   Marital status: Married    Spouse name: Not on file   Number of children: Not on file   Years of education: Not on file   Highest education level: Not on file  Occupational History   Not on file  Tobacco Use   Smoking status: Never   Smokeless tobacco: Never  Substance and Sexual Activity   Alcohol use: Never   Drug use: Never   Sexual activity: Not on file  Other Topics Concern   Not on file  Social History Narrative   Not on file   Social Determinants of Health   Financial Resource Strain: Not on file  Food Insecurity: Not on file  Transportation Needs: Not on file  Physical Activity: Not on file  Stress: Not on file  Social Connections: Not on file  Intimate Partner Violence: Not on file    Review of Systems:  All other review of systems negative except as mentioned in the HPI.  Physical Exam: Vital signs in  last 24 hours: Blood Pressure 122/73   Pulse 69   Temperature 98.6 F (37 C)   Height '5\' 7"'$  (1.702 m)   Weight 182 lb (82.6 kg)   Oxygen Saturation 94%   Body Mass Index 28.51 kg/m  General:   Alert, NAD Lungs:  Clear .   Heart:  Regular rate and rhythm Abdomen:  Soft, nontender and nondistended. Neuro/Psych:  Alert and cooperative. Normal mood and affect. A and O x 3  Reviewed labs, radiology imaging, old records and pertinent past GI work up  Patient is appropriate for planned procedure(s) and anesthesia in an ambulatory setting   K. Denzil Magnuson , MD [phone]

## 2022-06-10 ENCOUNTER — Telehealth: Payer: Self-pay

## 2022-06-10 NOTE — Telephone Encounter (Signed)
Left message on follow up call. 

## 2022-06-23 ENCOUNTER — Encounter: Payer: Self-pay | Admitting: Gastroenterology

## 2022-06-24 DIAGNOSIS — E785 Hyperlipidemia, unspecified: Secondary | ICD-10-CM | POA: Diagnosis not present

## 2022-06-24 DIAGNOSIS — M109 Gout, unspecified: Secondary | ICD-10-CM | POA: Diagnosis not present

## 2022-06-24 DIAGNOSIS — Z125 Encounter for screening for malignant neoplasm of prostate: Secondary | ICD-10-CM | POA: Diagnosis not present

## 2022-06-24 DIAGNOSIS — I10 Essential (primary) hypertension: Secondary | ICD-10-CM | POA: Diagnosis not present

## 2022-07-01 DIAGNOSIS — Z0000 Encounter for general adult medical examination without abnormal findings: Secondary | ICD-10-CM | POA: Diagnosis not present

## 2022-07-01 DIAGNOSIS — I10 Essential (primary) hypertension: Secondary | ICD-10-CM | POA: Diagnosis not present

## 2022-07-01 DIAGNOSIS — E785 Hyperlipidemia, unspecified: Secondary | ICD-10-CM | POA: Diagnosis not present

## 2022-07-01 DIAGNOSIS — R3589 Other polyuria: Secondary | ICD-10-CM | POA: Diagnosis not present

## 2022-07-01 DIAGNOSIS — Z1339 Encounter for screening examination for other mental health and behavioral disorders: Secondary | ICD-10-CM | POA: Diagnosis not present

## 2022-07-01 DIAGNOSIS — M109 Gout, unspecified: Secondary | ICD-10-CM | POA: Diagnosis not present

## 2022-07-01 DIAGNOSIS — Z1331 Encounter for screening for depression: Secondary | ICD-10-CM | POA: Diagnosis not present

## 2022-07-22 DIAGNOSIS — H5213 Myopia, bilateral: Secondary | ICD-10-CM | POA: Diagnosis not present

## 2022-07-22 DIAGNOSIS — H52203 Unspecified astigmatism, bilateral: Secondary | ICD-10-CM | POA: Diagnosis not present

## 2022-07-22 DIAGNOSIS — H524 Presbyopia: Secondary | ICD-10-CM | POA: Diagnosis not present

## 2022-07-22 DIAGNOSIS — H2513 Age-related nuclear cataract, bilateral: Secondary | ICD-10-CM | POA: Diagnosis not present

## 2022-11-02 DIAGNOSIS — R051 Acute cough: Secondary | ICD-10-CM | POA: Diagnosis not present

## 2022-11-02 DIAGNOSIS — J069 Acute upper respiratory infection, unspecified: Secondary | ICD-10-CM | POA: Diagnosis not present

## 2022-11-02 DIAGNOSIS — Z20822 Contact with and (suspected) exposure to covid-19: Secondary | ICD-10-CM | POA: Diagnosis not present

## 2023-01-06 DIAGNOSIS — L821 Other seborrheic keratosis: Secondary | ICD-10-CM | POA: Diagnosis not present

## 2023-01-06 DIAGNOSIS — L820 Inflamed seborrheic keratosis: Secondary | ICD-10-CM | POA: Diagnosis not present

## 2023-03-20 DIAGNOSIS — D225 Melanocytic nevi of trunk: Secondary | ICD-10-CM | POA: Diagnosis not present

## 2023-03-20 DIAGNOSIS — L570 Actinic keratosis: Secondary | ICD-10-CM | POA: Diagnosis not present

## 2023-03-20 DIAGNOSIS — L821 Other seborrheic keratosis: Secondary | ICD-10-CM | POA: Diagnosis not present

## 2023-03-20 DIAGNOSIS — L853 Xerosis cutis: Secondary | ICD-10-CM | POA: Diagnosis not present

## 2023-03-20 DIAGNOSIS — D2262 Melanocytic nevi of left upper limb, including shoulder: Secondary | ICD-10-CM | POA: Diagnosis not present

## 2023-04-07 DIAGNOSIS — Z20822 Contact with and (suspected) exposure to covid-19: Secondary | ICD-10-CM | POA: Diagnosis not present

## 2023-04-07 DIAGNOSIS — N401 Enlarged prostate with lower urinary tract symptoms: Secondary | ICD-10-CM | POA: Diagnosis not present

## 2023-04-07 DIAGNOSIS — J101 Influenza due to other identified influenza virus with other respiratory manifestations: Secondary | ICD-10-CM | POA: Diagnosis not present

## 2023-04-07 DIAGNOSIS — R350 Frequency of micturition: Secondary | ICD-10-CM | POA: Diagnosis not present

## 2023-04-07 DIAGNOSIS — R351 Nocturia: Secondary | ICD-10-CM | POA: Diagnosis not present

## 2023-05-11 DIAGNOSIS — I10 Essential (primary) hypertension: Secondary | ICD-10-CM | POA: Diagnosis not present

## 2023-05-11 DIAGNOSIS — I861 Scrotal varices: Secondary | ICD-10-CM | POA: Diagnosis not present

## 2023-06-21 DIAGNOSIS — J019 Acute sinusitis, unspecified: Secondary | ICD-10-CM | POA: Diagnosis not present

## 2023-06-30 DIAGNOSIS — E785 Hyperlipidemia, unspecified: Secondary | ICD-10-CM | POA: Diagnosis not present

## 2023-06-30 DIAGNOSIS — Z125 Encounter for screening for malignant neoplasm of prostate: Secondary | ICD-10-CM | POA: Diagnosis not present

## 2023-06-30 DIAGNOSIS — M109 Gout, unspecified: Secondary | ICD-10-CM | POA: Diagnosis not present

## 2023-07-04 DIAGNOSIS — I10 Essential (primary) hypertension: Secondary | ICD-10-CM | POA: Diagnosis not present

## 2023-07-04 DIAGNOSIS — R82998 Other abnormal findings in urine: Secondary | ICD-10-CM | POA: Diagnosis not present

## 2023-07-04 DIAGNOSIS — M109 Gout, unspecified: Secondary | ICD-10-CM | POA: Diagnosis not present

## 2023-07-04 DIAGNOSIS — Z1331 Encounter for screening for depression: Secondary | ICD-10-CM | POA: Diagnosis not present

## 2023-07-04 DIAGNOSIS — R7401 Elevation of levels of liver transaminase levels: Secondary | ICD-10-CM | POA: Diagnosis not present

## 2023-07-04 DIAGNOSIS — Z0000 Encounter for general adult medical examination without abnormal findings: Secondary | ICD-10-CM | POA: Diagnosis not present

## 2023-07-04 DIAGNOSIS — M79675 Pain in left toe(s): Secondary | ICD-10-CM | POA: Diagnosis not present

## 2023-07-04 DIAGNOSIS — Z1339 Encounter for screening examination for other mental health and behavioral disorders: Secondary | ICD-10-CM | POA: Diagnosis not present

## 2023-07-21 DIAGNOSIS — R351 Nocturia: Secondary | ICD-10-CM | POA: Diagnosis not present

## 2023-07-21 DIAGNOSIS — N401 Enlarged prostate with lower urinary tract symptoms: Secondary | ICD-10-CM | POA: Diagnosis not present

## 2023-08-09 DIAGNOSIS — M545 Low back pain, unspecified: Secondary | ICD-10-CM | POA: Diagnosis not present

## 2023-08-09 DIAGNOSIS — M109 Gout, unspecified: Secondary | ICD-10-CM | POA: Diagnosis not present

## 2023-08-09 DIAGNOSIS — E785 Hyperlipidemia, unspecified: Secondary | ICD-10-CM | POA: Diagnosis not present

## 2023-08-24 DIAGNOSIS — R0683 Snoring: Secondary | ICD-10-CM | POA: Diagnosis not present

## 2023-09-13 DIAGNOSIS — M10072 Idiopathic gout, left ankle and foot: Secondary | ICD-10-CM | POA: Diagnosis not present

## 2023-09-13 DIAGNOSIS — M79672 Pain in left foot: Secondary | ICD-10-CM | POA: Diagnosis not present

## 2023-09-28 DIAGNOSIS — G4733 Obstructive sleep apnea (adult) (pediatric): Secondary | ICD-10-CM | POA: Diagnosis not present

## 2023-10-04 DIAGNOSIS — G4733 Obstructive sleep apnea (adult) (pediatric): Secondary | ICD-10-CM | POA: Diagnosis not present

## 2023-10-04 DIAGNOSIS — M109 Gout, unspecified: Secondary | ICD-10-CM | POA: Diagnosis not present
